# Patient Record
Sex: Female | Born: 1945 | Race: White | Hispanic: No | Marital: Married | Smoking: Never smoker
Health system: Southern US, Community
[De-identification: ages and names within clinical notes are randomized; demographics above are authoritative.]

## PROBLEM LIST (undated history)

## (undated) DIAGNOSIS — I10 Essential (primary) hypertension: Secondary | ICD-10-CM

## (undated) HISTORY — PX: CHOLECYSTECTOMY: SHX55

---

## 2009-01-20 ENCOUNTER — Ambulatory Visit: Payer: Self-pay | Admitting: Family Medicine

## 2009-02-09 ENCOUNTER — Ambulatory Visit: Payer: Self-pay

## 2014-02-10 ENCOUNTER — Ambulatory Visit: Payer: Self-pay

## 2014-02-10 LAB — URINALYSIS, COMPLETE
BILIRUBIN, UR: NEGATIVE
Bacteria: NEGATIVE
Blood: NEGATIVE
Glucose,UR: NEGATIVE mg/dL (ref 0–75)
Ketone: NEGATIVE
Leukocyte Esterase: NEGATIVE
Nitrite: NEGATIVE
Ph: 6 (ref 4.5–8.0)
Protein: NEGATIVE
RBC,UR: NONE SEEN /HPF (ref 0–5)
SPECIFIC GRAVITY: 1.005 (ref 1.003–1.030)

## 2014-02-11 LAB — URINE CULTURE

## 2018-03-06 ENCOUNTER — Emergency Department
Admission: EM | Admit: 2018-03-06 | Discharge: 2018-03-06 | Disposition: A | Discharge status: Home / Self Care | Payer: Medicare HMO | Attending: Emergency Medicine | Admitting: Emergency Medicine

## 2018-03-06 ENCOUNTER — Encounter: Payer: Self-pay | Admitting: *Deleted

## 2018-03-06 ENCOUNTER — Other Ambulatory Visit: Payer: Self-pay

## 2018-03-06 DIAGNOSIS — R1110 Vomiting, unspecified: Secondary | ICD-10-CM | POA: Diagnosis not present

## 2018-03-06 DIAGNOSIS — H81399 Other peripheral vertigo, unspecified ear: Secondary | ICD-10-CM | POA: Diagnosis not present

## 2018-03-06 DIAGNOSIS — R42 Dizziness and giddiness: Secondary | ICD-10-CM | POA: Diagnosis not present

## 2018-03-06 LAB — BASIC METABOLIC PANEL
Anion gap: 7 (ref 5–15)
BUN: 25 mg/dL — ABNORMAL HIGH (ref 6–20)
CO2: 26 mmol/L (ref 22–32)
Calcium: 8.8 mg/dL — ABNORMAL LOW (ref 8.9–10.3)
Chloride: 109 mmol/L (ref 101–111)
Creatinine, Ser: 1.01 mg/dL — ABNORMAL HIGH (ref 0.44–1.00)
GFR calc Af Amer: >60 mL/min (ref >60)
GFR calc non Af Amer: 55 mL/min — ABNORMAL LOW (ref >60)
Glucose, Bld: 137 mg/dL — ABNORMAL HIGH (ref 65–99)
Potassium: 3.5 mmol/L (ref 3.5–5.1)
Sodium: 142 mmol/L (ref 135–145)

## 2018-03-06 LAB — HEPATIC FUNCTION PANEL
ALT: 14 U/L (ref 14–54)
AST: 21 U/L (ref 15–41)
Albumin: 3.9 g/dL (ref 3.5–5.0)
Alkaline Phosphatase: 68 U/L (ref 38–126)
Bilirubin, Direct: <0.1 mg/dL — ABNORMAL LOW (ref 0.1–0.5)
Total Bilirubin: 0.8 mg/dL (ref 0.3–1.2)
Total Protein: 6.7 g/dL (ref 6.5–8.1)

## 2018-03-06 LAB — URINALYSIS, COMPLETE (UACMP) WITH MICROSCOPIC
BILIRUBIN URINE: NEGATIVE
Glucose, UA: NEGATIVE mg/dL
Ketones, ur: NEGATIVE mg/dL
Leukocytes, UA: NEGATIVE
NITRITE: NEGATIVE
PH: 6 (ref 5.0–8.0)
Protein, ur: NEGATIVE mg/dL
SPECIFIC GRAVITY, URINE: 1.013 (ref 1.005–1.030)

## 2018-03-06 LAB — CBC
HEMATOCRIT: 37.8 % (ref 35.0–47.0)
HEMOGLOBIN: 12.9 g/dL (ref 12.0–16.0)
MCH: 32 pg (ref 26.0–34.0)
MCHC: 34.2 g/dL (ref 32.0–36.0)
MCV: 93.5 fL (ref 80.0–100.0)
Platelets: 188 10*3/uL (ref 150–440)
RBC: 4.04 MIL/uL (ref 3.80–5.20)
RDW: 12.9 % (ref 11.5–14.5)
WBC: 6.4 10*3/uL (ref 3.6–11.0)

## 2018-03-06 LAB — ETHANOL: Alcohol, Ethyl (B): <10 mg/dL (ref <10)

## 2018-03-06 LAB — LIPASE, BLOOD: Lipase: 35 U/L (ref 11–51)

## 2018-03-06 LAB — TROPONIN I: Troponin I: <0.03 ng/mL (ref <0.03)

## 2018-03-06 MED ORDER — MECLIZINE HCL 25 MG PO TABS
25.0000 mg | ORAL_TABLET | Freq: Once | ORAL | Status: AC
  Administered 2018-03-06: 25 mg via ORAL
  Filled 2018-03-06: qty 1

## 2018-03-06 MED ORDER — ONDANSETRON HCL 4 MG PO TABS: 4.0000 mg | ORAL_TABLET | Freq: Three times a day (TID) | ORAL | 0 refills | Status: AC | PRN

## 2018-03-06 MED ORDER — MECLIZINE HCL 25 MG PO TABS: 25.0000 mg | ORAL_TABLET | Freq: Three times a day (TID) | ORAL | 0 refills | Status: AC | PRN

## 2018-03-06 MED ORDER — SODIUM CHLORIDE 0.9 % IV BOLUS
1000.0000 mL | INTRAVENOUS | Status: AC
  Administered 2018-03-06: 1000 mL via INTRAVENOUS

## 2018-03-06 MED ORDER — ONDANSETRON HCL 4 MG/2ML IJ SOLN
4.0000 mg | Freq: Once | INTRAMUSCULAR | Status: AC
  Administered 2018-03-06: 4 mg via INTRAVENOUS
  Filled 2018-03-06: qty 2

## 2018-03-06 NOTE — ED Provider Notes (Signed)
[facility] Emergency Department Provider Note ____________________________________________   First MD Initiated Contact with Patient 03/06/18 [id number]     (approximate)  I have reviewed the triage vital signs and the nursing notes.   HISTORY  Chief Complaint Dizziness    HPI RYLAN KAUFMANN is a 72 y.o. female with PMH as noted below who presents with dizziness, acute onset this morning few hours ago, occurring when she went to the bathroom, and described as feeling like the room was spinning and that she had to steady herself against furniture.  The patient states that she had a bowel movement, and then returned to her bed.  Then she felt nauseous, return to the bathroom, and felt so dizzy that she had to sit down on the ground.  Patient was unable to get up after this.  She states that she did not lose consciousness, did not feel like she was going to pass out.  She reports one prior episode of this about a week ago which was brief and much more mild.   History reviewed. No pertinent past medical history.  There are no active problems to display for this patient.   Past Surgical History:  Procedure Laterality Date  . CHOLECYSTECTOMY      Prior to Admission medications   Medication Sig Start Date End Date Taking? Authorizing Provider  ibuprofen (ADVIL,MOTRIN) 200 MG tablet Take 200 mg by mouth every 6 (six) hours as needed for mild pain or moderate pain.   Yes [provider]  meclizine (ANTIVERT) 25 MG tablet Take 1 tablet (25 mg total) by mouth 3 (three) times daily as needed for up to 10 days for dizziness. 03/06/18 03/16/18  Dionne Bucy, MD  ondansetron (ZOFRAN) 4 MG tablet Take 1 tablet (4 mg total) by mouth every 8 (eight) hours as needed for up to 5 days for nausea or vomiting. 03/06/18 03/11/18  Dionne Bucy, MD    Allergies Patient has no known allergies.  History reviewed. No pertinent family history.  Social  History Social History   Tobacco Use  . Smoking status: Never Smoker  . Smokeless tobacco: Never Used  Substance Use Topics  . Alcohol use: Yes    Alcohol/week: 1.8 oz    Types: 3 Cans of beer per week  . Drug use: Never    Review of Systems  Constitutional: No fever. Eyes: No visual changes. ENT: No sore throat.  No ear pain or change in hearing. Cardiovascular: Denies chest pain. Respiratory: Denies shortness of breath. Gastrointestinal: Positive for vomiting.  Genitourinary: Negative for dysuria.  Musculoskeletal: Negative for back pain. Skin: Negative for rash. Neurological: Negative for headache.   ____________________________________________   PHYSICAL EXAM:  VITAL SIGNS: ED Triage Vitals  Enc Vitals Group     BP 03/06/18 0527 (!) 162/70     Pulse Rate 03/06/18 0527 67     Resp 03/06/18 0527 18     Temp 03/06/18 0527 98 F (36.7 C)     Temp Source 03/06/18 0527 Oral     SpO2 03/06/18 0527 99 %     Weight 03/06/18 0528 155 lb (70.3 kg)     Height 03/06/18 0528 5\' 6"  (1.676 m)     Head Circumference --      Peak Flow --      Pain Score 03/06/18 0528 0     Pain Loc --      Pain Edu? --      Excl. in GC? --  Constitutional: Alert and oriented.  Slightly uncomfortable appearing but in no acute distress. Eyes: Conjunctivae are normal.  EOMI.  PERRLA.  No nystagmus. Head: Atraumatic.  Bilateral TMs clear. Nose: No congestion/rhinnorhea. Mouth/Throat: Mucous membranes are dry.   Neck: Normal range of motion.  Cardiovascular: Normal rate, regular rhythm. Grossly normal heart sounds.  Good peripheral circulation. Respiratory: Normal respiratory effort.  No retractions. Lungs CTAB. Gastrointestinal: Soft and nontender. No distention.  Genitourinary: No flank tenderness. Musculoskeletal: No lower extremity edema.  Extremities warm and well perfused.  Neurologic:  Normal speech and language.  Motor intact in all extremities.  Normal coordination with  finger-to-nose.  No gross focal neurologic deficits are appreciated.  Skin:  Skin is warm and dry. No rash noted. Psychiatric: Mood and affect are normal. Speech and behavior are normal.  ____________________________________________   LABS (all labs ordered are listed, but only abnormal results are displayed)  Labs Reviewed  BASIC METABOLIC PANEL - Abnormal; Notable for the following components:      Result Value   Glucose, Bld 137 (*)    BUN 25 (*)    Creatinine, Ser 1.01 (*)    Calcium 8.8 (*)    GFR calc non Af Amer 55 (*)    All other components within normal limits  URINALYSIS, COMPLETE (UACMP) WITH MICROSCOPIC - Abnormal; Notable for the following components:   Color, Urine YELLOW (*)    APPearance CLEAR (*)    Hgb urine dipstick SMALL (*)    All other components within normal limits  HEPATIC FUNCTION PANEL - Abnormal; Notable for the following components:   Bilirubin, Direct <0.1 (*)    All other components within normal limits  CBC  ETHANOL  LIPASE, BLOOD  TROPONIN I  CBG MONITORING, ED   ____________________________________________  EKG  ED ECG REPORT I, Dionne BucySebastian Misbah Hornaday, the attending physician, personally viewed and interpreted this ECG.  Date: 03/06/2018 EKG Time: 520 Rate: 62 Rhythm: normal sinus rhythm QRS Axis: normal Intervals: normal ST/T Wave abnormalities: normal Narrative Interpretation: no evidence of acute ischemia  ____________________________________________  RADIOLOGY    ____________________________________________   PROCEDURES  Procedure(s) performed: No  Procedures  Critical Care performed: No ____________________________________________   INITIAL IMPRESSION / ASSESSMENT AND PLAN / ED COURSE  Pertinent labs & imaging results that were available during my care of the patient were reviewed by me and considered in my medical decision making (see chart for details).  72 year old female with PMH as noted above presents  with acute onset of dizziness associated with vomiting, and described as a spinning sensation.  No near syncope per the patient.  Patient had several beers and a cider drink last night which is unusual for her.  On exam, the patient is relatively well-appearing, vital signs are normal except for hypertension, and the remainder the exam is as described above.  Neuro exam is normal.  Overall presentation is most consistent with peripheral vertigo given the spinning sensation and no actual near syncope.  As it is acute onset and associated with vomiting, as well as changing with position, it is not consistent with CNS cause.  Also consider vasovagal episode and dehydration, although patient denies feeling like she would pass out.  Plan: IV fluids, labs, symptomatic treatment with Zofran and meclizine, and reassess.    ----------------------------------------- 10:07 AM on 03/06/2018 -----------------------------------------  Lab work-up is unremarkable.  Patient feels significantly better after meclizine and fluids.  She feels well to go home.  I explained the results of the work-up, and  gave the patient return precautions.  She expressed understanding.  ____________________________________________   FINAL CLINICAL IMPRESSION(S) / ED DIAGNOSES  Final diagnoses:  Peripheral vertigo, unspecified laterality      NEW MEDICATIONS STARTED DURING THIS VISIT:  New Prescriptions   MECLIZINE (ANTIVERT) 25 MG TABLET    Take 1 tablet (25 mg total) by mouth 3 (three) times daily as needed for up to 10 days for dizziness.   ONDANSETRON (ZOFRAN) 4 MG TABLET    Take 1 tablet (4 mg total) by mouth every 8 (eight) hours as needed for up to 5 days for nausea or vomiting.     Note:  This document was prepared using Dragon voice recognition software and may include unintentional dictation errors.    Dionne Bucy, MD 03/06/18 1007

## 2018-03-06 NOTE — Discharge Instructions (Addendum)
Return to the ER for new, worsening, or persistent dizziness, weakness or lightheadedness, headache, vision changes, vomiting, or any other new or worsening symptoms that concern you.  You may take the meclizine as needed for the dizziness, and the Zofran as needed for nausea.  Follow-up with your regular doctor.

## 2018-03-06 NOTE — ED Triage Notes (Signed)
Per EMS and patient  pt was in bed and felt sick to her stomach, pt got up and had a normal bowel movement and then was sweating and weak. Tried to get back to bed and felt weak and had to have husband help her. She made it back to the bathroom and vomited x 1 then had to sit on floor r/t dizziness.

## 2018-06-03 DIAGNOSIS — H43811 Vitreous degeneration, right eye: Secondary | ICD-10-CM | POA: Diagnosis not present

## 2018-07-06 DIAGNOSIS — H524 Presbyopia: Secondary | ICD-10-CM | POA: Diagnosis not present

## 2018-07-06 DIAGNOSIS — H43811 Vitreous degeneration, right eye: Secondary | ICD-10-CM | POA: Diagnosis not present

## 2018-07-28 DIAGNOSIS — N898 Other specified noninflammatory disorders of vagina: Secondary | ICD-10-CM | POA: Diagnosis not present

## 2018-07-28 DIAGNOSIS — R8290 Unspecified abnormal findings in urine: Secondary | ICD-10-CM | POA: Diagnosis not present

## 2018-07-28 DIAGNOSIS — R399 Unspecified symptoms and signs involving the genitourinary system: Secondary | ICD-10-CM | POA: Diagnosis not present

## 2018-10-22 DIAGNOSIS — R300 Dysuria: Secondary | ICD-10-CM | POA: Diagnosis not present

## 2018-10-22 DIAGNOSIS — R21 Rash and other nonspecific skin eruption: Secondary | ICD-10-CM | POA: Diagnosis not present

## 2018-10-22 DIAGNOSIS — N9089 Other specified noninflammatory disorders of vulva and perineum: Secondary | ICD-10-CM | POA: Diagnosis not present

## 2018-10-27 DIAGNOSIS — Z1211 Encounter for screening for malignant neoplasm of colon: Secondary | ICD-10-CM | POA: Diagnosis not present

## 2018-10-27 DIAGNOSIS — Z0000 Encounter for general adult medical examination without abnormal findings: Secondary | ICD-10-CM | POA: Diagnosis not present

## 2018-10-27 DIAGNOSIS — Z131 Encounter for screening for diabetes mellitus: Secondary | ICD-10-CM | POA: Diagnosis not present

## 2018-10-27 DIAGNOSIS — I10 Essential (primary) hypertension: Secondary | ICD-10-CM | POA: Diagnosis not present

## 2018-10-27 DIAGNOSIS — Z13220 Encounter for screening for lipoid disorders: Secondary | ICD-10-CM | POA: Diagnosis not present

## 2018-10-28 ENCOUNTER — Other Ambulatory Visit: Payer: Self-pay | Admitting: Family Medicine

## 2018-10-28 DIAGNOSIS — Z131 Encounter for screening for diabetes mellitus: Secondary | ICD-10-CM | POA: Diagnosis not present

## 2018-10-28 DIAGNOSIS — Z1231 Encounter for screening mammogram for malignant neoplasm of breast: Secondary | ICD-10-CM

## 2018-10-28 DIAGNOSIS — Z13220 Encounter for screening for lipoid disorders: Secondary | ICD-10-CM | POA: Diagnosis not present

## 2018-10-28 DIAGNOSIS — Z0000 Encounter for general adult medical examination without abnormal findings: Secondary | ICD-10-CM | POA: Diagnosis not present

## 2018-10-28 DIAGNOSIS — I10 Essential (primary) hypertension: Secondary | ICD-10-CM | POA: Diagnosis not present

## 2018-10-28 DIAGNOSIS — Z1211 Encounter for screening for malignant neoplasm of colon: Secondary | ICD-10-CM | POA: Diagnosis not present

## 2018-11-06 DIAGNOSIS — I10 Essential (primary) hypertension: Secondary | ICD-10-CM | POA: Diagnosis not present

## 2018-11-06 DIAGNOSIS — N183 Chronic kidney disease, stage 3 (moderate): Secondary | ICD-10-CM | POA: Diagnosis not present

## 2018-11-06 DIAGNOSIS — Z1211 Encounter for screening for malignant neoplasm of colon: Secondary | ICD-10-CM | POA: Diagnosis not present

## 2018-12-03 DIAGNOSIS — H43812 Vitreous degeneration, left eye: Secondary | ICD-10-CM | POA: Diagnosis not present

## 2019-02-04 DIAGNOSIS — G8191 Hemiplegia, unspecified affecting right dominant side: Secondary | ICD-10-CM | POA: Diagnosis not present

## 2019-02-04 DIAGNOSIS — R200 Anesthesia of skin: Secondary | ICD-10-CM | POA: Diagnosis not present

## 2019-02-04 DIAGNOSIS — I6523 Occlusion and stenosis of bilateral carotid arteries: Secondary | ICD-10-CM | POA: Diagnosis not present

## 2019-02-04 DIAGNOSIS — R29898 Other symptoms and signs involving the musculoskeletal system: Secondary | ICD-10-CM | POA: Diagnosis not present

## 2019-02-04 DIAGNOSIS — R29701 NIHSS score 1: Secondary | ICD-10-CM | POA: Diagnosis not present

## 2019-02-04 DIAGNOSIS — I639 Cerebral infarction, unspecified: Secondary | ICD-10-CM | POA: Diagnosis not present

## 2019-02-04 DIAGNOSIS — I69319 Unspecified symptoms and signs involving cognitive functions following cerebral infarction: Secondary | ICD-10-CM | POA: Diagnosis not present

## 2019-02-04 DIAGNOSIS — R1312 Dysphagia, oropharyngeal phase: Secondary | ICD-10-CM | POA: Diagnosis not present

## 2019-02-04 DIAGNOSIS — G459 Transient cerebral ischemic attack, unspecified: Secondary | ICD-10-CM | POA: Diagnosis not present

## 2019-02-04 DIAGNOSIS — R531 Weakness: Secondary | ICD-10-CM | POA: Diagnosis not present

## 2019-02-04 DIAGNOSIS — R471 Dysarthria and anarthria: Secondary | ICD-10-CM | POA: Diagnosis not present

## 2019-02-04 DIAGNOSIS — I7774 Dissection of vertebral artery: Secondary | ICD-10-CM | POA: Diagnosis not present

## 2019-02-04 DIAGNOSIS — R42 Dizziness and giddiness: Secondary | ICD-10-CM | POA: Diagnosis not present

## 2019-02-04 DIAGNOSIS — I10 Essential (primary) hypertension: Secondary | ICD-10-CM | POA: Diagnosis not present

## 2019-02-04 DIAGNOSIS — R270 Ataxia, unspecified: Secondary | ICD-10-CM | POA: Diagnosis not present

## 2019-03-02 DIAGNOSIS — Z0000 Encounter for general adult medical examination without abnormal findings: Secondary | ICD-10-CM | POA: Diagnosis not present

## 2019-03-02 DIAGNOSIS — I10 Essential (primary) hypertension: Secondary | ICD-10-CM | POA: Diagnosis not present

## 2019-03-02 DIAGNOSIS — I7774 Dissection of vertebral artery: Secondary | ICD-10-CM | POA: Diagnosis not present

## 2019-03-02 DIAGNOSIS — I639 Cerebral infarction, unspecified: Secondary | ICD-10-CM | POA: Diagnosis not present

## 2019-03-22 DIAGNOSIS — I10 Essential (primary) hypertension: Secondary | ICD-10-CM | POA: Diagnosis not present

## 2019-04-07 DIAGNOSIS — I639 Cerebral infarction, unspecified: Secondary | ICD-10-CM | POA: Diagnosis not present

## 2019-04-07 DIAGNOSIS — I7774 Dissection of vertebral artery: Secondary | ICD-10-CM | POA: Diagnosis not present

## 2019-06-22 ENCOUNTER — Other Ambulatory Visit: Payer: Self-pay | Admitting: Family Medicine

## 2019-06-22 DIAGNOSIS — I7774 Dissection of vertebral artery: Secondary | ICD-10-CM | POA: Diagnosis not present

## 2019-06-22 DIAGNOSIS — I10 Essential (primary) hypertension: Secondary | ICD-10-CM | POA: Diagnosis not present

## 2019-06-24 ENCOUNTER — Other Ambulatory Visit: Payer: Self-pay | Admitting: Family Medicine

## 2019-06-24 DIAGNOSIS — Z8673 Personal history of transient ischemic attack (TIA), and cerebral infarction without residual deficits: Secondary | ICD-10-CM

## 2019-06-24 DIAGNOSIS — I7774 Dissection of vertebral artery: Secondary | ICD-10-CM

## 2019-06-28 ENCOUNTER — Other Ambulatory Visit: Payer: Self-pay

## 2019-06-28 ENCOUNTER — Ambulatory Visit
Admission: RE | Admit: 2019-06-28 | Discharge: 2019-06-28 | Disposition: A | Discharge status: Home / Self Care | Payer: Medicare HMO | Source: Ambulatory Visit | Attending: Family Medicine | Admitting: Family Medicine

## 2019-06-28 ENCOUNTER — Encounter (INDEPENDENT_AMBULATORY_CARE_PROVIDER_SITE_OTHER): Payer: Self-pay

## 2019-06-28 DIAGNOSIS — I7774 Dissection of vertebral artery: Secondary | ICD-10-CM | POA: Insufficient documentation

## 2019-06-28 DIAGNOSIS — Z8673 Personal history of transient ischemic attack (TIA), and cerebral infarction without residual deficits: Secondary | ICD-10-CM

## 2019-06-28 DIAGNOSIS — I6523 Occlusion and stenosis of bilateral carotid arteries: Secondary | ICD-10-CM | POA: Diagnosis not present

## 2019-06-28 HISTORY — DX: Essential (primary) hypertension: I10

## 2019-06-28 MED ORDER — IOPAMIDOL (ISOVUE-370) INJECTION 76%
75.0000 mL | Freq: Once | INTRAVENOUS | Status: AC | PRN
  Administered 2019-06-28: 09:00:00 75 mL via INTRAVENOUS

## 2019-08-17 DIAGNOSIS — H2513 Age-related nuclear cataract, bilateral: Secondary | ICD-10-CM | POA: Diagnosis not present

## 2020-01-03 DIAGNOSIS — R002 Palpitations: Secondary | ICD-10-CM | POA: Diagnosis not present

## 2020-01-03 DIAGNOSIS — R42 Dizziness and giddiness: Secondary | ICD-10-CM | POA: Diagnosis not present

## 2020-01-03 DIAGNOSIS — I479 Paroxysmal tachycardia, unspecified: Secondary | ICD-10-CM | POA: Diagnosis not present

## 2020-01-03 DIAGNOSIS — R001 Bradycardia, unspecified: Secondary | ICD-10-CM | POA: Diagnosis not present

## 2020-01-03 DIAGNOSIS — Z8673 Personal history of transient ischemic attack (TIA), and cerebral infarction without residual deficits: Secondary | ICD-10-CM | POA: Diagnosis not present

## 2020-01-06 DIAGNOSIS — R002 Palpitations: Secondary | ICD-10-CM | POA: Diagnosis not present

## 2020-01-18 DIAGNOSIS — R002 Palpitations: Secondary | ICD-10-CM | POA: Diagnosis not present

## 2020-01-18 DIAGNOSIS — I10 Essential (primary) hypertension: Secondary | ICD-10-CM | POA: Diagnosis not present

## 2020-01-18 DIAGNOSIS — I493 Ventricular premature depolarization: Secondary | ICD-10-CM | POA: Diagnosis not present

## 2020-04-19 DIAGNOSIS — I10 Essential (primary) hypertension: Secondary | ICD-10-CM | POA: Diagnosis not present

## 2020-04-19 DIAGNOSIS — R002 Palpitations: Secondary | ICD-10-CM | POA: Diagnosis not present

## 2020-04-19 DIAGNOSIS — I493 Ventricular premature depolarization: Secondary | ICD-10-CM | POA: Diagnosis not present

## 2020-05-02 DIAGNOSIS — N898 Other specified noninflammatory disorders of vagina: Secondary | ICD-10-CM | POA: Diagnosis not present

## 2020-05-02 DIAGNOSIS — R350 Frequency of micturition: Secondary | ICD-10-CM | POA: Diagnosis not present

## 2020-06-27 DIAGNOSIS — I10 Essential (primary) hypertension: Secondary | ICD-10-CM | POA: Diagnosis not present

## 2020-06-27 DIAGNOSIS — R002 Palpitations: Secondary | ICD-10-CM | POA: Diagnosis not present

## 2020-06-27 DIAGNOSIS — I7774 Dissection of vertebral artery: Secondary | ICD-10-CM | POA: Diagnosis not present

## 2020-06-29 DIAGNOSIS — Z8673 Personal history of transient ischemic attack (TIA), and cerebral infarction without residual deficits: Secondary | ICD-10-CM | POA: Diagnosis not present

## 2020-06-29 DIAGNOSIS — I10 Essential (primary) hypertension: Secondary | ICD-10-CM | POA: Diagnosis not present

## 2020-06-29 DIAGNOSIS — R069 Unspecified abnormalities of breathing: Secondary | ICD-10-CM | POA: Diagnosis not present

## 2020-06-29 DIAGNOSIS — Z8679 Personal history of other diseases of the circulatory system: Secondary | ICD-10-CM | POA: Diagnosis not present

## 2020-06-29 DIAGNOSIS — R29818 Other symptoms and signs involving the nervous system: Secondary | ICD-10-CM | POA: Diagnosis not present

## 2020-06-29 DIAGNOSIS — R2689 Other abnormalities of gait and mobility: Secondary | ICD-10-CM | POA: Diagnosis not present

## 2020-06-29 DIAGNOSIS — G459 Transient cerebral ischemic attack, unspecified: Secondary | ICD-10-CM | POA: Diagnosis not present

## 2020-06-29 DIAGNOSIS — I6501 Occlusion and stenosis of right vertebral artery: Secondary | ICD-10-CM | POA: Diagnosis not present

## 2020-06-29 DIAGNOSIS — I491 Atrial premature depolarization: Secondary | ICD-10-CM | POA: Diagnosis not present

## 2020-06-29 DIAGNOSIS — R0602 Shortness of breath: Secondary | ICD-10-CM | POA: Diagnosis not present

## 2020-06-29 DIAGNOSIS — R1310 Dysphagia, unspecified: Secondary | ICD-10-CM | POA: Diagnosis not present

## 2020-06-29 DIAGNOSIS — Z7901 Long term (current) use of anticoagulants: Secondary | ICD-10-CM | POA: Diagnosis not present

## 2020-06-29 DIAGNOSIS — E785 Hyperlipidemia, unspecified: Secondary | ICD-10-CM | POA: Diagnosis not present

## 2020-07-31 DIAGNOSIS — I7774 Dissection of vertebral artery: Secondary | ICD-10-CM | POA: Diagnosis not present

## 2020-07-31 DIAGNOSIS — Z23 Encounter for immunization: Secondary | ICD-10-CM | POA: Diagnosis not present

## 2020-07-31 DIAGNOSIS — R1310 Dysphagia, unspecified: Secondary | ICD-10-CM | POA: Diagnosis not present

## 2020-07-31 DIAGNOSIS — I10 Essential (primary) hypertension: Secondary | ICD-10-CM | POA: Diagnosis not present

## 2020-08-04 ENCOUNTER — Other Ambulatory Visit: Payer: Self-pay | Admitting: Family Medicine

## 2020-08-04 DIAGNOSIS — I639 Cerebral infarction, unspecified: Secondary | ICD-10-CM

## 2020-08-04 DIAGNOSIS — R1310 Dysphagia, unspecified: Secondary | ICD-10-CM

## 2020-08-07 DIAGNOSIS — Z8673 Personal history of transient ischemic attack (TIA), and cerebral infarction without residual deficits: Secondary | ICD-10-CM | POA: Diagnosis not present

## 2020-08-14 ENCOUNTER — Other Ambulatory Visit: Payer: Self-pay

## 2020-08-14 ENCOUNTER — Ambulatory Visit
Admission: RE | Admit: 2020-08-14 | Discharge: 2020-08-14 | Disposition: A | Discharge status: Home / Self Care | Payer: Medicare HMO | Source: Ambulatory Visit | Attending: Family Medicine | Admitting: Family Medicine

## 2020-08-14 DIAGNOSIS — I639 Cerebral infarction, unspecified: Secondary | ICD-10-CM | POA: Diagnosis not present

## 2020-08-14 DIAGNOSIS — Z0389 Encounter for observation for other suspected diseases and conditions ruled out: Secondary | ICD-10-CM | POA: Diagnosis not present

## 2020-08-14 DIAGNOSIS — R1310 Dysphagia, unspecified: Secondary | ICD-10-CM | POA: Diagnosis not present

## 2020-08-14 NOTE — Therapy (Signed)
[facility] DIAGNOSTIC RADIOLOGY [address] [place], [place], [address] Phone: [phone]   Fax:     Modified Barium Swallow  Patient Details  Name: Heidi Estrada MRN: [id number] Date of Birth: 1946-03-29 No data recorded  Encounter Date: 08/14/2020   End of Session - 08/14/20 1405    Visit Number 1    Number of Visits 1    Date for SLP Re-Evaluation 08/14/20    SLP Start Time 1230    SLP Stop Time  1315    SLP Time Calculation (min) 45 min    Activity Tolerance Patient tolerated treatment well           Past Medical History:  Diagnosis Date  . Hypertension     Past Surgical History:  Procedure Laterality Date  . CHOLECYSTECTOMY      There were no vitals filed for this visit.        Subjective: Patient behavior: (alertness, ability to follow instructions, etc.):  The patient is alert, able to relate her swallowing complaints, and follow directions  Chief complaint: 06/29/2020: patient seen in ED secondary sudden onset inability to tolerate secretion", imaging revealed no new infarct, patient but reports swallowing difficulty resolved but she has been hoarse since the event.   Objective:  Radiological Procedure: A videoflouroscopic evaluation of oral-preparatory, reflex initiation, and pharyngeal phases of the swallow was performed; as well as a screening of the upper esophageal phase.  I. POSTURE: Upright in MBS chair  II. VIEW: Lateral  III. COMPENSATORY STRATEGIES: N/A  IV. BOLUSES ADMINISTERED:   Thin Liquid: 1 small, 3 rapid consecutive   Nectar-thick Liquid: 1 moderate   Honey-thick Liquid: DNT   Puree: 1 teaspoon, 1 heaping teaspoon   Mechanical Soft: 1/4 graham cracker in applesauce   V. RESULTS OF EVALUATION: A. ORAL PREPARATORY PHASE: (The lips, tongue, and velum are observed for strength and coordination)       **Overall Severity Rating: within normal limits  B. SWALLOW INITIATION/REFLEX:  (The reflex is normal if "triggered" by the time the bolus reached the base of the tongue)  **Overall Severity Rating: Mild, triggers while falling from the valleculae to the pyriform sinuses  C. PHARYNGEAL PHASE: (Pharyngeal function is normal if the bolus shows rapid, smooth, and continuous transit through the pharynx and there is no pharyngeal residue after the swallow) within normal limits  **Overall Severity Rating:  D. LARYNGEAL PENETRATION: (Material entering into the laryngeal inlet/vestibule but not aspirated) None  E. ASPIRATION: None  F. ESOPHAGEAL PHASE: (Screening of the upper esophagus) No observed abnormality within the viewable cervical esophagus  ASSESSMENT: This 74 year old woman; S/P stroke 2 years ago and recent episode of inability to swallow (ED 06/29/2020); is presenting with minimal oropharyngeal dysphagia characterized by delayed pharyngeal swallow initiation.  Oral control of the bolus including oral hold, rotary mastication, and anterior to posterior transfer is within functional limits.   Aspects of the pharyngeal stage of swallowing including tongue base retraction, hyolaryngeal excursion, epiglottic inversion, and duration/amplitude of UES opening are within normal limits.  There is no observed pharyngeal residue, laryngeal penetration, or tracheal aspiration.  The patient is not at risk for prandial aspiration.  Delayed pharyngeal swallow initiation is consistent with effects of laryngopharyngeal reflux (inflammation, edema, and resultant decreased sensation of the larynx and pharynx).  The patient reports vocal hoarseness that began about the time of the ED visit.  Recommend referral to ENT.  PLAN/RECOMMENDATIONS:   A. Diet: Regular  B. Swallowing Precautions: No special precautions indicated by this study   C. Recommended consultation to: ENT   D. Therapy recommendations: speech therapy is not indicated for swallowing   E. Results and recommendations were  discussed with the patient immediately following the study and the final report routed to the referring practitioner.     Dysphagia, unspecified type - Plan: DG SWALLOW FUNC OP MEDICARE SPEECH PATH, DG SWALLOW FUNC OP MEDICARE SPEECH PATH  Cerebellar stroke, acute (HCC) - Plan: DG SWALLOW FUNC OP MEDICARE SPEECH PATH, DG SWALLOW FUNC OP MEDICARE SPEECH PATH        Problem List There are no problems to display for this patient.  Heidi Primrose, MS/CCC- SLP  Heidi Estrada 08/14/2020, 2:06 PM  [facility] DIAGNOSTIC RADIOLOGY [address] [place], [place], [address] Phone: [phone]   Fax:     Name: Heidi Estrada MRN: [id number] Date of Birth: 1945/12/27

## 2020-08-15 DIAGNOSIS — H2513 Age-related nuclear cataract, bilateral: Secondary | ICD-10-CM | POA: Diagnosis not present

## 2020-08-28 DIAGNOSIS — J069 Acute upper respiratory infection, unspecified: Secondary | ICD-10-CM | POA: Diagnosis not present

## 2020-08-28 DIAGNOSIS — I10 Essential (primary) hypertension: Secondary | ICD-10-CM | POA: Diagnosis not present

## 2020-08-28 DIAGNOSIS — Z20822 Contact with and (suspected) exposure to covid-19: Secondary | ICD-10-CM | POA: Diagnosis not present

## 2020-09-01 DIAGNOSIS — R52 Pain, unspecified: Secondary | ICD-10-CM | POA: Diagnosis not present

## 2020-09-01 DIAGNOSIS — R0602 Shortness of breath: Secondary | ICD-10-CM | POA: Diagnosis not present

## 2020-09-01 DIAGNOSIS — I959 Hypotension, unspecified: Secondary | ICD-10-CM | POA: Diagnosis not present

## 2020-09-20 ENCOUNTER — Ambulatory Visit
Admission: RE | Admit: 2020-09-20 | Discharge: 2020-09-20 | Disposition: A | Discharge status: Home / Self Care | Payer: Medicare HMO | Source: Ambulatory Visit | Attending: Family Medicine | Admitting: Family Medicine

## 2020-09-20 ENCOUNTER — Other Ambulatory Visit (HOSPITAL_COMMUNITY): Payer: Self-pay | Admitting: Family Medicine

## 2020-09-20 ENCOUNTER — Other Ambulatory Visit: Payer: Self-pay

## 2020-09-20 ENCOUNTER — Other Ambulatory Visit: Payer: Self-pay | Admitting: Family Medicine

## 2020-09-20 DIAGNOSIS — Z1211 Encounter for screening for malignant neoplasm of colon: Secondary | ICD-10-CM | POA: Diagnosis not present

## 2020-09-20 DIAGNOSIS — R000 Tachycardia, unspecified: Secondary | ICD-10-CM | POA: Diagnosis not present

## 2020-09-20 DIAGNOSIS — R0602 Shortness of breath: Secondary | ICD-10-CM | POA: Diagnosis not present

## 2020-09-20 DIAGNOSIS — J90 Pleural effusion, not elsewhere classified: Secondary | ICD-10-CM | POA: Diagnosis not present

## 2020-09-20 DIAGNOSIS — E041 Nontoxic single thyroid nodule: Secondary | ICD-10-CM | POA: Diagnosis not present

## 2020-09-20 DIAGNOSIS — R058 Other specified cough: Secondary | ICD-10-CM | POA: Diagnosis not present

## 2020-09-20 DIAGNOSIS — Z9049 Acquired absence of other specified parts of digestive tract: Secondary | ICD-10-CM | POA: Diagnosis not present

## 2020-09-20 DIAGNOSIS — D649 Anemia, unspecified: Secondary | ICD-10-CM | POA: Diagnosis not present

## 2020-09-20 DIAGNOSIS — R059 Cough, unspecified: Secondary | ICD-10-CM | POA: Diagnosis not present

## 2020-09-20 DIAGNOSIS — Z8616 Personal history of COVID-19: Secondary | ICD-10-CM | POA: Diagnosis not present

## 2020-09-20 LAB — POCT I-STAT CREATININE: Creatinine, Ser: 0.8 mg/dL (ref 0.44–1.00)

## 2020-09-20 MED ORDER — IOHEXOL 350 MG/ML SOLN
75.0000 mL | Freq: Once | INTRAVENOUS | Status: AC | PRN
  Administered 2020-09-20: 75 mL via INTRAVENOUS

## 2020-09-25 DIAGNOSIS — U071 COVID-19: Secondary | ICD-10-CM | POA: Diagnosis not present

## 2020-09-25 DIAGNOSIS — J90 Pleural effusion, not elsewhere classified: Secondary | ICD-10-CM | POA: Diagnosis not present

## 2020-09-25 DIAGNOSIS — R059 Cough, unspecified: Secondary | ICD-10-CM | POA: Diagnosis not present

## 2020-09-25 DIAGNOSIS — R0600 Dyspnea, unspecified: Secondary | ICD-10-CM | POA: Diagnosis not present

## 2020-09-25 DIAGNOSIS — E876 Hypokalemia: Secondary | ICD-10-CM | POA: Diagnosis not present

## 2020-09-25 DIAGNOSIS — J22 Unspecified acute lower respiratory infection: Secondary | ICD-10-CM | POA: Diagnosis not present

## 2020-09-26 DIAGNOSIS — J90 Pleural effusion, not elsewhere classified: Secondary | ICD-10-CM | POA: Diagnosis not present

## 2020-09-26 DIAGNOSIS — I313 Pericardial effusion (noninflammatory): Secondary | ICD-10-CM | POA: Diagnosis not present

## 2020-09-29 DIAGNOSIS — R0602 Shortness of breath: Secondary | ICD-10-CM | POA: Diagnosis not present

## 2020-09-29 DIAGNOSIS — R058 Other specified cough: Secondary | ICD-10-CM | POA: Diagnosis not present

## 2020-09-29 DIAGNOSIS — Z8616 Personal history of COVID-19: Secondary | ICD-10-CM | POA: Diagnosis not present

## 2020-09-29 DIAGNOSIS — R000 Tachycardia, unspecified: Secondary | ICD-10-CM | POA: Diagnosis not present

## 2020-09-29 DIAGNOSIS — D649 Anemia, unspecified: Secondary | ICD-10-CM | POA: Diagnosis not present

## 2020-09-29 DIAGNOSIS — Z1211 Encounter for screening for malignant neoplasm of colon: Secondary | ICD-10-CM | POA: Diagnosis not present

## 2020-10-02 DIAGNOSIS — Z8673 Personal history of transient ischemic attack (TIA), and cerebral infarction without residual deficits: Secondary | ICD-10-CM | POA: Diagnosis not present

## 2020-10-03 DIAGNOSIS — Z23 Encounter for immunization: Secondary | ICD-10-CM | POA: Diagnosis not present

## 2020-10-03 DIAGNOSIS — I493 Ventricular premature depolarization: Secondary | ICD-10-CM | POA: Diagnosis not present

## 2020-10-03 DIAGNOSIS — I313 Pericardial effusion (noninflammatory): Secondary | ICD-10-CM | POA: Diagnosis not present

## 2020-10-03 DIAGNOSIS — J90 Pleural effusion, not elsewhere classified: Secondary | ICD-10-CM | POA: Diagnosis not present

## 2020-10-03 DIAGNOSIS — I10 Essential (primary) hypertension: Secondary | ICD-10-CM | POA: Diagnosis not present

## 2020-10-03 DIAGNOSIS — R002 Palpitations: Secondary | ICD-10-CM | POA: Diagnosis not present

## 2020-10-04 DIAGNOSIS — D649 Anemia, unspecified: Secondary | ICD-10-CM | POA: Diagnosis not present

## 2020-10-04 DIAGNOSIS — I10 Essential (primary) hypertension: Secondary | ICD-10-CM | POA: Diagnosis not present

## 2020-10-16 DIAGNOSIS — J90 Pleural effusion, not elsewhere classified: Secondary | ICD-10-CM | POA: Diagnosis not present

## 2020-10-16 DIAGNOSIS — Z8616 Personal history of COVID-19: Secondary | ICD-10-CM | POA: Diagnosis not present

## 2020-12-21 DIAGNOSIS — I10 Essential (primary) hypertension: Secondary | ICD-10-CM | POA: Diagnosis not present

## 2021-01-04 ENCOUNTER — Other Ambulatory Visit: Payer: Self-pay | Admitting: Family Medicine

## 2021-01-04 DIAGNOSIS — Z1211 Encounter for screening for malignant neoplasm of colon: Secondary | ICD-10-CM | POA: Diagnosis not present

## 2021-01-04 DIAGNOSIS — Z0000 Encounter for general adult medical examination without abnormal findings: Secondary | ICD-10-CM | POA: Diagnosis not present

## 2021-01-04 DIAGNOSIS — N183 Chronic kidney disease, stage 3 unspecified: Secondary | ICD-10-CM | POA: Diagnosis not present

## 2021-01-04 DIAGNOSIS — I129 Hypertensive chronic kidney disease with stage 1 through stage 4 chronic kidney disease, or unspecified chronic kidney disease: Secondary | ICD-10-CM | POA: Diagnosis not present

## 2021-01-04 DIAGNOSIS — I493 Ventricular premature depolarization: Secondary | ICD-10-CM | POA: Diagnosis not present

## 2021-01-04 DIAGNOSIS — Z1231 Encounter for screening mammogram for malignant neoplasm of breast: Secondary | ICD-10-CM

## 2021-01-04 DIAGNOSIS — I7774 Dissection of vertebral artery: Secondary | ICD-10-CM | POA: Diagnosis not present

## 2021-01-04 DIAGNOSIS — Z8673 Personal history of transient ischemic attack (TIA), and cerebral infarction without residual deficits: Secondary | ICD-10-CM | POA: Diagnosis not present

## 2021-01-17 DIAGNOSIS — Z1211 Encounter for screening for malignant neoplasm of colon: Secondary | ICD-10-CM | POA: Diagnosis not present

## 2021-01-25 ENCOUNTER — Other Ambulatory Visit: Payer: Self-pay

## 2021-01-25 ENCOUNTER — Ambulatory Visit
Admission: RE | Admit: 2021-01-25 | Discharge: 2021-01-25 | Disposition: A | Discharge status: Home / Self Care | Payer: Medicare HMO | Source: Ambulatory Visit | Attending: Family Medicine | Admitting: Family Medicine

## 2021-01-25 DIAGNOSIS — Z1231 Encounter for screening mammogram for malignant neoplasm of breast: Secondary | ICD-10-CM | POA: Diagnosis not present

## 2021-01-25 LAB — EXTERNAL GENERIC LAB PROCEDURE: COLOGUARD: NEGATIVE

## 2021-01-31 ENCOUNTER — Other Ambulatory Visit: Payer: Self-pay | Admitting: Family Medicine

## 2021-01-31 DIAGNOSIS — R928 Other abnormal and inconclusive findings on diagnostic imaging of breast: Secondary | ICD-10-CM

## 2021-01-31 DIAGNOSIS — N6489 Other specified disorders of breast: Secondary | ICD-10-CM

## 2021-02-08 ENCOUNTER — Ambulatory Visit
Admission: RE | Admit: 2021-02-08 | Discharge: 2021-02-08 | Disposition: A | Discharge status: Home / Self Care | Payer: Medicare HMO | Source: Ambulatory Visit | Attending: Family Medicine | Admitting: Family Medicine

## 2021-02-08 ENCOUNTER — Other Ambulatory Visit: Payer: Self-pay

## 2021-02-08 DIAGNOSIS — N6489 Other specified disorders of breast: Secondary | ICD-10-CM

## 2021-02-08 DIAGNOSIS — R928 Other abnormal and inconclusive findings on diagnostic imaging of breast: Secondary | ICD-10-CM | POA: Diagnosis not present

## 2021-02-08 DIAGNOSIS — R922 Inconclusive mammogram: Secondary | ICD-10-CM | POA: Diagnosis not present

## 2021-03-20 DIAGNOSIS — Z8673 Personal history of transient ischemic attack (TIA), and cerebral infarction without residual deficits: Secondary | ICD-10-CM | POA: Diagnosis not present

## 2021-03-20 DIAGNOSIS — I313 Pericardial effusion (noninflammatory): Secondary | ICD-10-CM | POA: Diagnosis not present

## 2021-03-20 DIAGNOSIS — R002 Palpitations: Secondary | ICD-10-CM | POA: Diagnosis not present

## 2021-03-20 DIAGNOSIS — I10 Essential (primary) hypertension: Secondary | ICD-10-CM | POA: Diagnosis not present

## 2021-04-02 DIAGNOSIS — I10 Essential (primary) hypertension: Secondary | ICD-10-CM | POA: Diagnosis not present

## 2021-04-02 DIAGNOSIS — Z8673 Personal history of transient ischemic attack (TIA), and cerebral infarction without residual deficits: Secondary | ICD-10-CM | POA: Diagnosis not present

## 2021-06-27 DIAGNOSIS — I10 Essential (primary) hypertension: Secondary | ICD-10-CM | POA: Diagnosis not present

## 2021-07-04 DIAGNOSIS — I313 Pericardial effusion (noninflammatory): Secondary | ICD-10-CM | POA: Diagnosis not present

## 2021-07-04 DIAGNOSIS — I10 Essential (primary) hypertension: Secondary | ICD-10-CM | POA: Diagnosis not present

## 2021-07-04 DIAGNOSIS — Z8673 Personal history of transient ischemic attack (TIA), and cerebral infarction without residual deficits: Secondary | ICD-10-CM | POA: Diagnosis not present

## 2021-07-04 DIAGNOSIS — R002 Palpitations: Secondary | ICD-10-CM | POA: Diagnosis not present

## 2021-07-04 DIAGNOSIS — E785 Hyperlipidemia, unspecified: Secondary | ICD-10-CM | POA: Diagnosis not present

## 2021-08-28 DIAGNOSIS — H2513 Age-related nuclear cataract, bilateral: Secondary | ICD-10-CM | POA: Diagnosis not present

## 2021-12-31 DIAGNOSIS — R7309 Other abnormal glucose: Secondary | ICD-10-CM | POA: Diagnosis not present

## 2021-12-31 DIAGNOSIS — I10 Essential (primary) hypertension: Secondary | ICD-10-CM | POA: Diagnosis not present

## 2021-12-31 DIAGNOSIS — N1831 Chronic kidney disease, stage 3a: Secondary | ICD-10-CM | POA: Diagnosis not present

## 2021-12-31 DIAGNOSIS — Z0000 Encounter for general adult medical examination without abnormal findings: Secondary | ICD-10-CM | POA: Diagnosis not present

## 2022-01-07 DIAGNOSIS — I7774 Dissection of vertebral artery: Secondary | ICD-10-CM | POA: Diagnosis not present

## 2022-01-07 DIAGNOSIS — Z0000 Encounter for general adult medical examination without abnormal findings: Secondary | ICD-10-CM | POA: Diagnosis not present

## 2022-01-07 DIAGNOSIS — I129 Hypertensive chronic kidney disease with stage 1 through stage 4 chronic kidney disease, or unspecified chronic kidney disease: Secondary | ICD-10-CM | POA: Diagnosis not present

## 2022-01-07 DIAGNOSIS — K219 Gastro-esophageal reflux disease without esophagitis: Secondary | ICD-10-CM | POA: Diagnosis not present

## 2022-01-07 DIAGNOSIS — Z8673 Personal history of transient ischemic attack (TIA), and cerebral infarction without residual deficits: Secondary | ICD-10-CM | POA: Diagnosis not present

## 2022-01-07 DIAGNOSIS — N1831 Chronic kidney disease, stage 3a: Secondary | ICD-10-CM | POA: Diagnosis not present

## 2022-01-21 DIAGNOSIS — Z8673 Personal history of transient ischemic attack (TIA), and cerebral infarction without residual deficits: Secondary | ICD-10-CM | POA: Diagnosis not present

## 2022-01-21 DIAGNOSIS — I10 Essential (primary) hypertension: Secondary | ICD-10-CM | POA: Diagnosis not present

## 2022-01-21 DIAGNOSIS — I3139 Other pericardial effusion (noninflammatory): Secondary | ICD-10-CM | POA: Diagnosis not present

## 2022-03-29 DIAGNOSIS — R21 Rash and other nonspecific skin eruption: Secondary | ICD-10-CM | POA: Diagnosis not present

## 2022-04-02 DIAGNOSIS — I7774 Dissection of vertebral artery: Secondary | ICD-10-CM | POA: Diagnosis not present

## 2022-04-02 DIAGNOSIS — I639 Cerebral infarction, unspecified: Secondary | ICD-10-CM | POA: Diagnosis not present

## 2022-04-02 DIAGNOSIS — I10 Essential (primary) hypertension: Secondary | ICD-10-CM | POA: Diagnosis not present

## 2022-05-31 DIAGNOSIS — I7774 Dissection of vertebral artery: Secondary | ICD-10-CM | POA: Diagnosis not present

## 2022-05-31 DIAGNOSIS — Z8673 Personal history of transient ischemic attack (TIA), and cerebral infarction without residual deficits: Secondary | ICD-10-CM | POA: Diagnosis not present

## 2022-05-31 DIAGNOSIS — I129 Hypertensive chronic kidney disease with stage 1 through stage 4 chronic kidney disease, or unspecified chronic kidney disease: Secondary | ICD-10-CM | POA: Diagnosis not present

## 2022-05-31 DIAGNOSIS — N1831 Chronic kidney disease, stage 3a: Secondary | ICD-10-CM | POA: Diagnosis not present

## 2022-05-31 DIAGNOSIS — R21 Rash and other nonspecific skin eruption: Secondary | ICD-10-CM | POA: Diagnosis not present

## 2022-05-31 DIAGNOSIS — I3139 Other pericardial effusion (noninflammatory): Secondary | ICD-10-CM | POA: Diagnosis not present

## 2022-05-31 DIAGNOSIS — K219 Gastro-esophageal reflux disease without esophagitis: Secondary | ICD-10-CM | POA: Diagnosis not present

## 2022-08-28 DIAGNOSIS — H2513 Age-related nuclear cataract, bilateral: Secondary | ICD-10-CM | POA: Diagnosis not present

## 2022-09-09 DIAGNOSIS — L2081 Atopic neurodermatitis: Secondary | ICD-10-CM | POA: Diagnosis not present

## 2022-09-20 DIAGNOSIS — M25551 Pain in right hip: Secondary | ICD-10-CM | POA: Diagnosis not present

## 2022-09-20 DIAGNOSIS — Z8673 Personal history of transient ischemic attack (TIA), and cerebral infarction without residual deficits: Secondary | ICD-10-CM | POA: Diagnosis not present

## 2022-09-20 DIAGNOSIS — M545 Low back pain, unspecified: Secondary | ICD-10-CM | POA: Diagnosis not present

## 2022-10-07 DIAGNOSIS — U071 COVID-19: Secondary | ICD-10-CM | POA: Diagnosis not present

## 2022-10-07 DIAGNOSIS — Z03818 Encounter for observation for suspected exposure to other biological agents ruled out: Secondary | ICD-10-CM | POA: Diagnosis not present

## 2022-10-21 DIAGNOSIS — R21 Rash and other nonspecific skin eruption: Secondary | ICD-10-CM | POA: Diagnosis not present

## 2022-11-01 IMAGING — MG MM DIGITAL DIAGNOSTIC UNILAT*R* W/ TOMO W/ CAD
4 series · 4 of 12 positions shown · non-contrast
Comparison: Previous exam(s).

CLINICAL DATA: 74-year-old female for further evaluation of
possible RIGHT breast asymmetry on new baseline screening mammogram.

EXAM:
DIGITAL DIAGNOSTIC UNILATERAL RIGHT MAMMOGRAM WITH TOMOSYNTHESIS AND
CAD
TECHNIQUE: Right digital diagnostic mammography and breast tomosynthesis was
performed. The images were evaluated with computer-aided detection.

[R ML synth-2D]
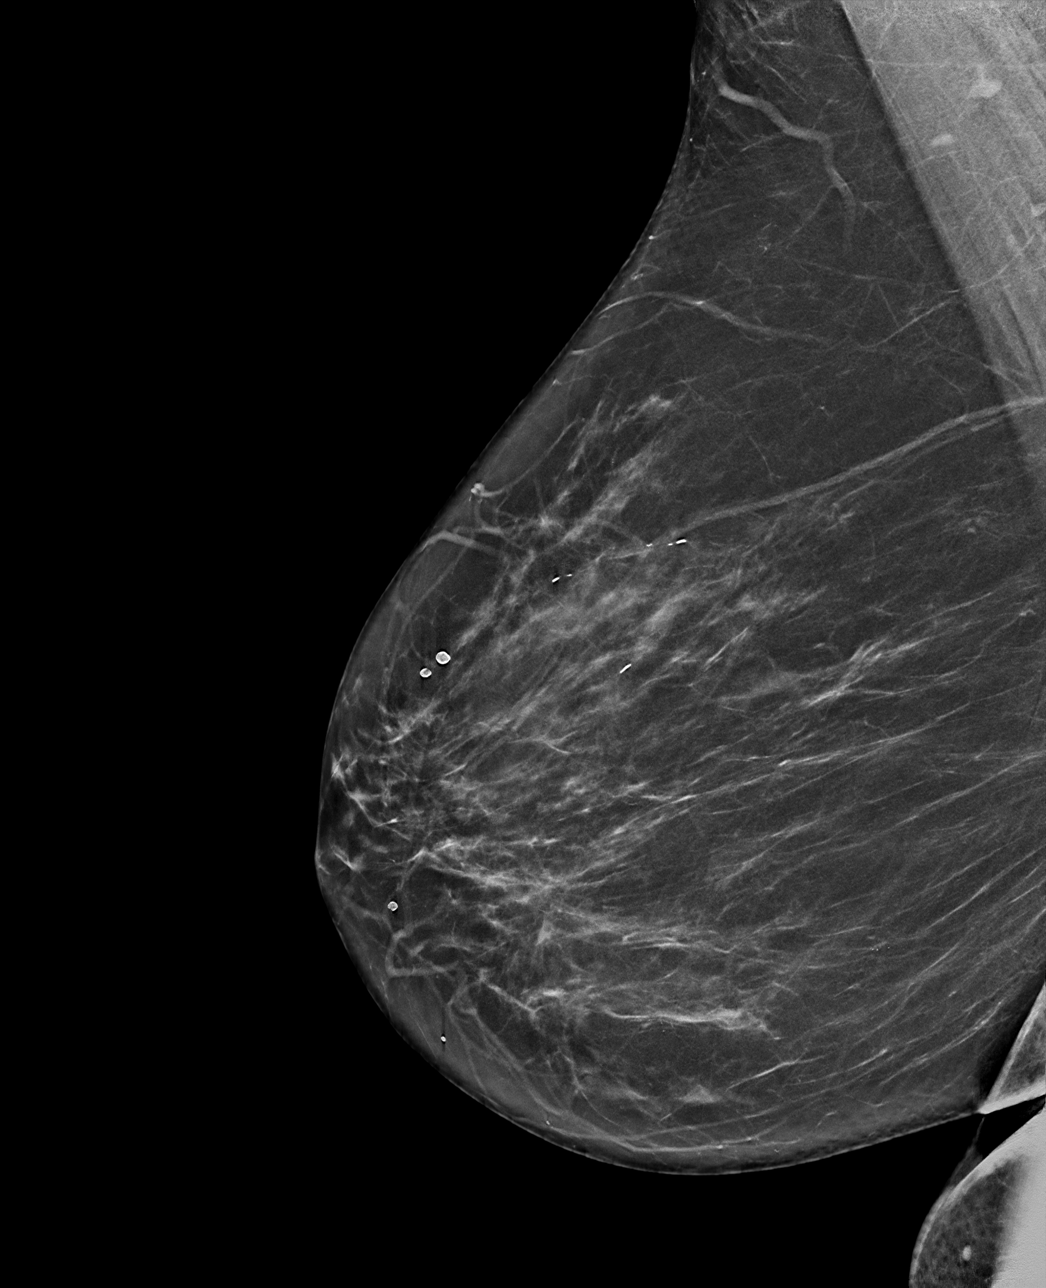

[R CC synth-2D]
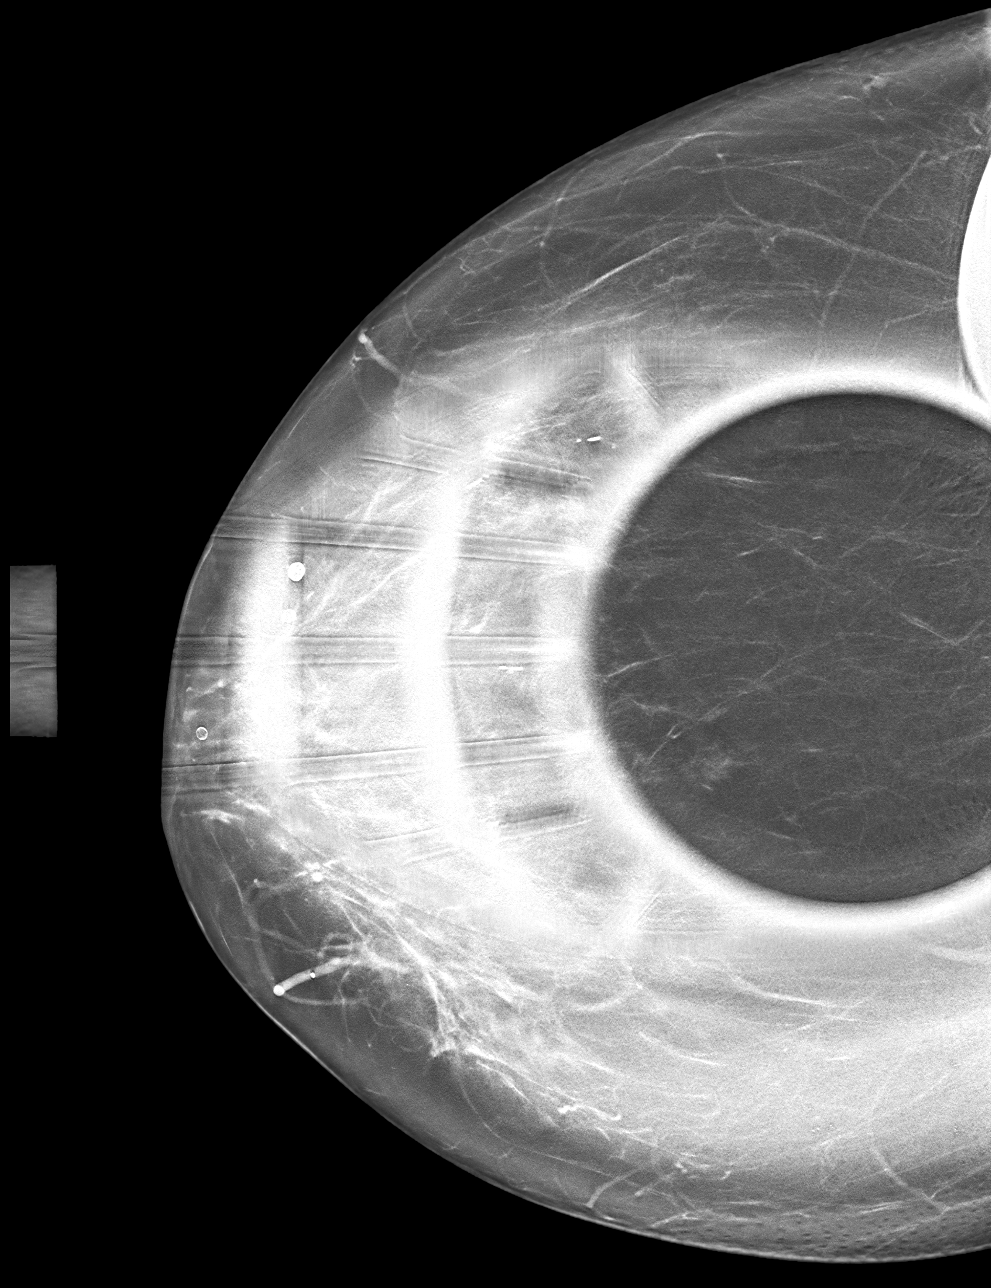

[R CC tomo · tomo slice 33/65.0]
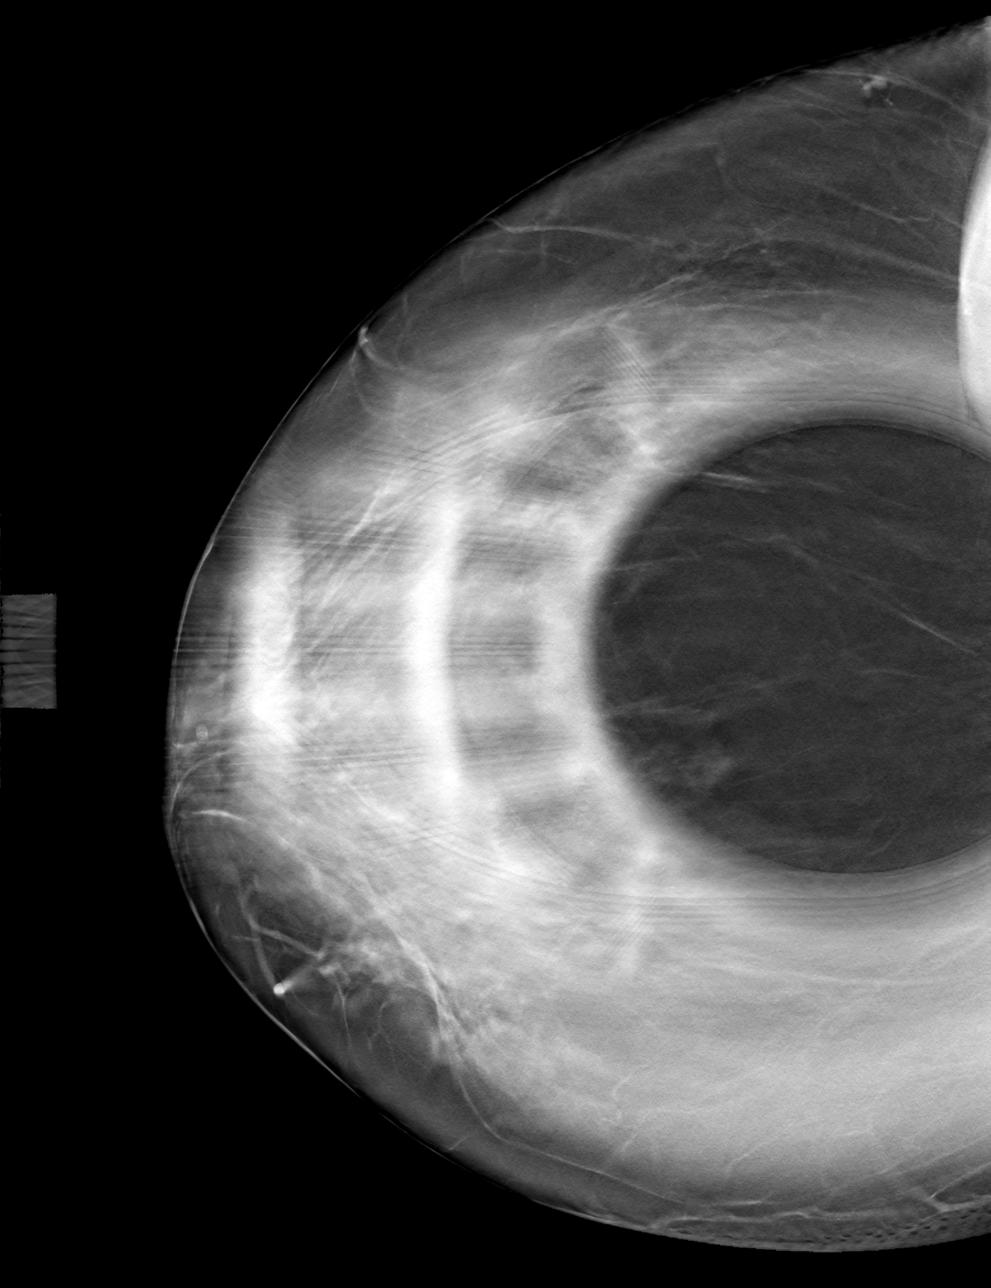

[R ML tomo · tomo slice 37/72.0]
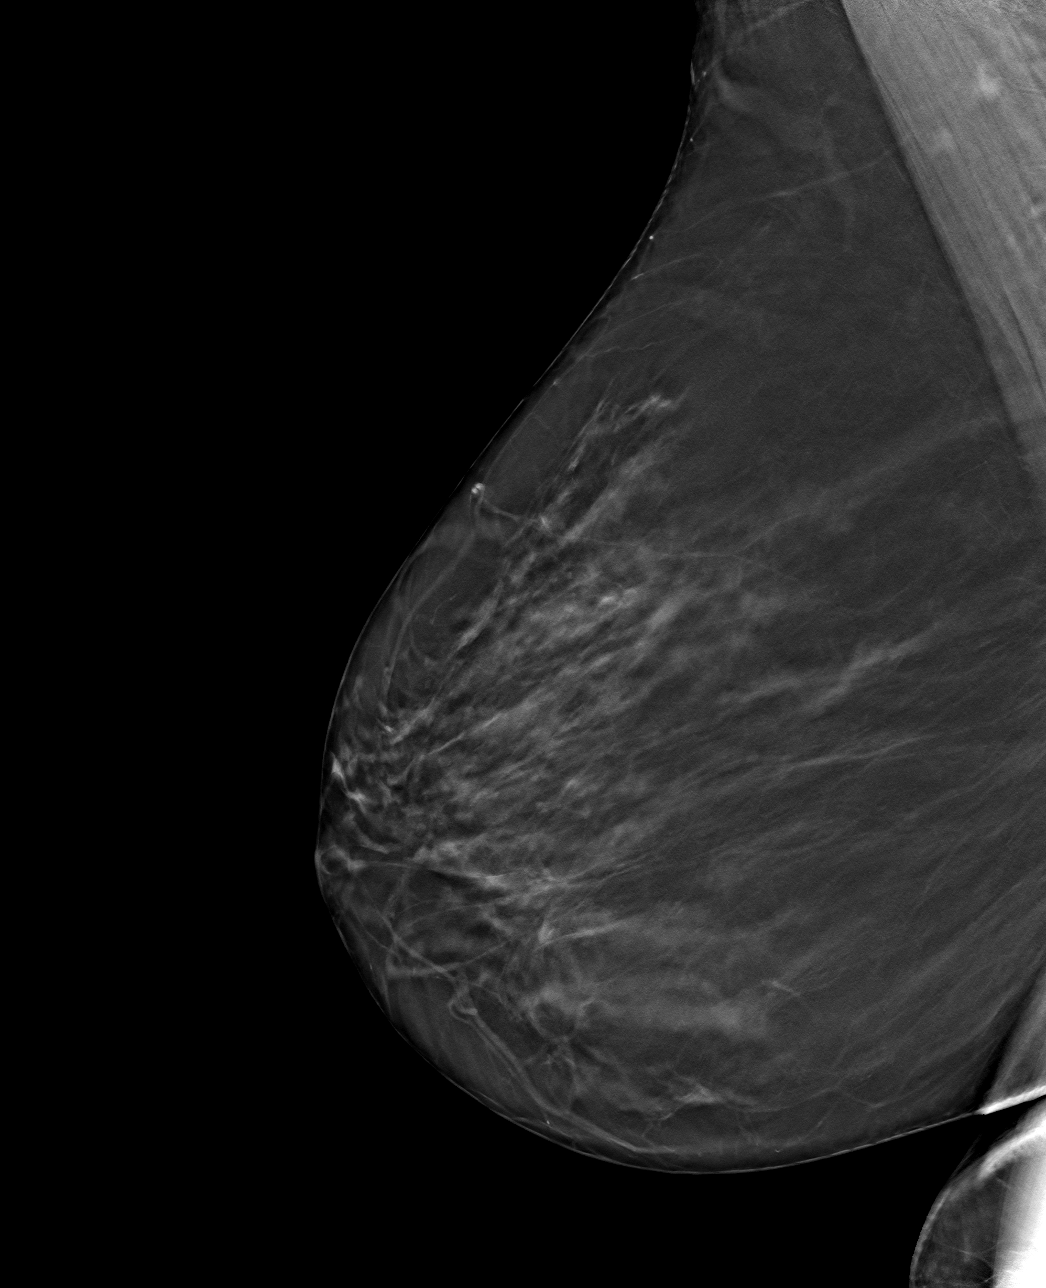

[4 of 12 positions shown; findings below may reference images not displayed]

ACR Breast Density Category b: There are scattered areas of
fibroglandular density.
FINDINGS: 2D/3D full field and spot compression views of the RIGHT breast
demonstrate dispersal of the central RIGHT breast asymmetry
identified only on the cc view. No persistent suspicious
abnormalities are noted.
IMPRESSION: No persistent suspicious abnormality in the area of the screening
study finding.

RECOMMENDATION:
Bilateral screening mammogram in 1 year.

I have discussed the findings and recommendations with the patient.
If applicable, a reminder letter will be sent to the patient
regarding the next appointment.

BI-RADS CATEGORY  1: Negative.

## 2023-01-08 DIAGNOSIS — N1831 Chronic kidney disease, stage 3a: Secondary | ICD-10-CM | POA: Diagnosis not present

## 2023-01-08 DIAGNOSIS — Z0000 Encounter for general adult medical examination without abnormal findings: Secondary | ICD-10-CM | POA: Diagnosis not present

## 2023-01-08 DIAGNOSIS — I129 Hypertensive chronic kidney disease with stage 1 through stage 4 chronic kidney disease, or unspecified chronic kidney disease: Secondary | ICD-10-CM | POA: Diagnosis not present

## 2023-01-08 DIAGNOSIS — R7309 Other abnormal glucose: Secondary | ICD-10-CM | POA: Diagnosis not present

## 2023-01-08 DIAGNOSIS — Z8673 Personal history of transient ischemic attack (TIA), and cerebral infarction without residual deficits: Secondary | ICD-10-CM | POA: Diagnosis not present

## 2023-01-08 DIAGNOSIS — I7774 Dissection of vertebral artery: Secondary | ICD-10-CM | POA: Diagnosis not present

## 2023-01-08 DIAGNOSIS — K219 Gastro-esophageal reflux disease without esophagitis: Secondary | ICD-10-CM | POA: Diagnosis not present

## 2023-03-04 DIAGNOSIS — I493 Ventricular premature depolarization: Secondary | ICD-10-CM | POA: Diagnosis not present

## 2023-03-04 DIAGNOSIS — Z8673 Personal history of transient ischemic attack (TIA), and cerebral infarction without residual deficits: Secondary | ICD-10-CM | POA: Diagnosis not present

## 2023-03-04 DIAGNOSIS — I10 Essential (primary) hypertension: Secondary | ICD-10-CM | POA: Diagnosis not present

## 2023-03-04 DIAGNOSIS — Z23 Encounter for immunization: Secondary | ICD-10-CM | POA: Diagnosis not present

## 2023-04-23 DIAGNOSIS — I639 Cerebral infarction, unspecified: Secondary | ICD-10-CM | POA: Diagnosis not present

## 2023-04-23 DIAGNOSIS — I7774 Dissection of vertebral artery: Secondary | ICD-10-CM | POA: Diagnosis not present

## 2023-04-23 DIAGNOSIS — I10 Essential (primary) hypertension: Secondary | ICD-10-CM | POA: Diagnosis not present

## 2023-07-24 DIAGNOSIS — I493 Ventricular premature depolarization: Secondary | ICD-10-CM | POA: Diagnosis not present

## 2023-07-24 DIAGNOSIS — N1831 Chronic kidney disease, stage 3a: Secondary | ICD-10-CM | POA: Diagnosis not present

## 2023-07-24 DIAGNOSIS — I129 Hypertensive chronic kidney disease with stage 1 through stage 4 chronic kidney disease, or unspecified chronic kidney disease: Secondary | ICD-10-CM | POA: Diagnosis not present

## 2023-07-24 DIAGNOSIS — R7309 Other abnormal glucose: Secondary | ICD-10-CM | POA: Diagnosis not present

## 2023-07-24 DIAGNOSIS — Z8673 Personal history of transient ischemic attack (TIA), and cerebral infarction without residual deficits: Secondary | ICD-10-CM | POA: Diagnosis not present

## 2023-09-03 DIAGNOSIS — Z0100 Encounter for examination of eyes and vision without abnormal findings: Secondary | ICD-10-CM | POA: Diagnosis not present

## 2023-09-03 DIAGNOSIS — H2513 Age-related nuclear cataract, bilateral: Secondary | ICD-10-CM | POA: Diagnosis not present

## 2024-01-23 DIAGNOSIS — K219 Gastro-esophageal reflux disease without esophagitis: Secondary | ICD-10-CM | POA: Diagnosis not present

## 2024-01-23 DIAGNOSIS — Z8673 Personal history of transient ischemic attack (TIA), and cerebral infarction without residual deficits: Secondary | ICD-10-CM | POA: Diagnosis not present

## 2024-01-23 DIAGNOSIS — I129 Hypertensive chronic kidney disease with stage 1 through stage 4 chronic kidney disease, or unspecified chronic kidney disease: Secondary | ICD-10-CM | POA: Diagnosis not present

## 2024-01-23 DIAGNOSIS — R7309 Other abnormal glucose: Secondary | ICD-10-CM | POA: Diagnosis not present

## 2024-01-23 DIAGNOSIS — I7774 Dissection of vertebral artery: Secondary | ICD-10-CM | POA: Diagnosis not present

## 2024-01-23 DIAGNOSIS — N1831 Chronic kidney disease, stage 3a: Secondary | ICD-10-CM | POA: Diagnosis not present

## 2024-01-23 DIAGNOSIS — Z0000 Encounter for general adult medical examination without abnormal findings: Secondary | ICD-10-CM | POA: Diagnosis not present

## 2024-03-17 DIAGNOSIS — I10 Essential (primary) hypertension: Secondary | ICD-10-CM | POA: Diagnosis not present

## 2024-03-17 DIAGNOSIS — Z8673 Personal history of transient ischemic attack (TIA), and cerebral infarction without residual deficits: Secondary | ICD-10-CM | POA: Diagnosis not present

## 2024-03-17 DIAGNOSIS — I493 Ventricular premature depolarization: Secondary | ICD-10-CM | POA: Diagnosis not present

## 2024-03-17 DIAGNOSIS — N1831 Chronic kidney disease, stage 3a: Secondary | ICD-10-CM | POA: Diagnosis not present

## 2024-04-21 DIAGNOSIS — I7774 Dissection of vertebral artery: Secondary | ICD-10-CM | POA: Diagnosis not present

## 2024-04-21 DIAGNOSIS — I639 Cerebral infarction, unspecified: Secondary | ICD-10-CM | POA: Diagnosis not present

## 2024-04-21 DIAGNOSIS — I10 Essential (primary) hypertension: Secondary | ICD-10-CM | POA: Diagnosis not present

## 2024-09-07 DIAGNOSIS — H524 Presbyopia: Secondary | ICD-10-CM | POA: Diagnosis not present

## 2024-09-07 DIAGNOSIS — H2513 Age-related nuclear cataract, bilateral: Secondary | ICD-10-CM | POA: Diagnosis not present
